# Patient Record
Sex: Male | Born: 1970 | Race: Black or African American | Hispanic: No | Marital: Single | Smoking: Current every day smoker
Health system: Southern US, Community
[De-identification: ages and names within clinical notes are randomized; demographics above are authoritative.]

## PROBLEM LIST (undated history)

## (undated) HISTORY — PX: HERNIA REPAIR: SHX51

---

## 2016-01-18 ENCOUNTER — Encounter (HOSPITAL_COMMUNITY): Payer: Self-pay

## 2016-01-18 ENCOUNTER — Emergency Department (HOSPITAL_COMMUNITY)
Admission: EM | Admit: 2016-01-18 | Discharge: 2016-01-18 | Disposition: A | Discharge status: Home / Self Care | Payer: Medicaid Other | Attending: Emergency Medicine | Admitting: Emergency Medicine

## 2016-01-18 DIAGNOSIS — F17210 Nicotine dependence, cigarettes, uncomplicated: Secondary | ICD-10-CM | POA: Diagnosis not present

## 2016-01-18 DIAGNOSIS — M25532 Pain in left wrist: Secondary | ICD-10-CM | POA: Diagnosis present

## 2016-01-18 DIAGNOSIS — M778 Other enthesopathies, not elsewhere classified: Secondary | ICD-10-CM | POA: Diagnosis not present

## 2016-01-18 MED ORDER — NAPROXEN 500 MG PO TABS: 500.0000 mg | ORAL_TABLET | Freq: Two times a day (BID) | ORAL | 0 refills | Status: DC

## 2016-01-18 NOTE — ED Triage Notes (Signed)
Pt c/o of left wrist pain over a month. Pt moves all fingers well. No other complaints.

## 2016-01-20 NOTE — ED Provider Notes (Signed)
[facility] Provider Note   CSN: [id number] Arrival date & time: 01/18/16  0350     History   Chief Complaint Chief Complaint  Patient presents with  . Carpal Tunnel    left    HPI Aaron Brewer is a 45 y.o. male.  Left wrist pain for the past month without known injury. 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 He was concerned he had developed carpal tunnel syndrome. No numbness or weakness. No swelling, redness of fever.    The history is provided by the patient. No language interpreter was used.    History reviewed. No pertinent past medical history.  There are no active problems to display for this patient.   Past Surgical History:  Procedure Laterality Date  . HERNIA REPAIR         Home Medications    Prior to Admission medications   Medication Sig Start Date End Date Taking? Authorizing Provider  Multiple Vitamin (MULTIVITAMIN WITH MINERALS) TABS tablet Take 1 tablet by mouth once a week.   Yes Historical Provider, MD  naproxen (NAPROSYN) 500 MG tablet Take 1 tablet (500 mg total) by mouth 2 (two) times daily. 01/18/16   Elpidio Anis, PA-C    Family History History reviewed. No pertinent family history.  Social History Social History  Substance Use Topics  . Smoking status: Current Every Day Smoker    Packs/day: 0.50    Types: Cigarettes  . Smokeless tobacco: Never Used  . Alcohol use No     Allergies   Review of patient's allergies indicates no known allergies.   Review of Systems Review of Systems  Constitutional: Negative for chills and fever.  Musculoskeletal:       See HPI  Skin: Negative.  Negative for color change.  Neurological: Negative.  Negative for numbness.     Physical Exam Updated Vital Signs BP 129/74 (BP Location: Right Arm)   Pulse 74   Temp 97.8 F (36.6 C) (Oral)   Resp 16   Ht 6' (1.829 m)   Wt 117.9 kg   SpO2 100%   BMI 35.26 kg/m   Physical Exam    Constitutional: He is oriented to person, place, and time. He appears well-developed and well-nourished.  Neck: Normal range of motion.  Pulmonary/Chest: Effort normal.  Musculoskeletal: Normal range of motion.  Left wrist unremarkable in appearance. No swelling, redness. FROM. Mild tenderness bilateral lateral aspects.   Neurological: He is alert and oriented to person, place, and time.  Skin: Skin is warm and dry. Capillary refill takes less than 2 seconds.     ED Treatments / Results  Labs (all labs ordered are listed, but only abnormal results are displayed) Labs Reviewed - No data to display  EKG  EKG Interpretation None       Radiology No results found.  Procedures Procedures (including critical care time)  Medications Ordered in ED Medications - No data to display   Initial Impression / Assessment and Plan / ED Course  I have reviewed the triage vital signs and the nursing notes.  Pertinent labs & imaging results that were available during my care of the patient were reviewed by me and considered in my medical decision making (see chart for details).  Clinical Course    Uncomplicated tendonitis of left wrist.   Final Clinical Impressions(s) / ED Diagnoses   Final diagnoses:  Tendonitis of wrist, left    New Prescriptions Discharge  Medication List as of 01/18/2016  5:28 AM    START taking these medications   Details  naproxen (NAPROSYN) 500 MG tablet Take 1 tablet (500 mg total) by mouth 2 (two) times daily., Starting Mon 01/18/2016, Print         MelbourneShari Korene Dula, PA-C 01/20/16 [id number]    Dione Boozeavid Glick, MD 01/20/16 [phone]

## 2016-03-25 ENCOUNTER — Encounter (HOSPITAL_COMMUNITY): Payer: Self-pay

## 2016-03-25 DIAGNOSIS — F17210 Nicotine dependence, cigarettes, uncomplicated: Secondary | ICD-10-CM | POA: Insufficient documentation

## 2016-03-25 DIAGNOSIS — R1032 Left lower quadrant pain: Secondary | ICD-10-CM | POA: Diagnosis present

## 2016-03-25 DIAGNOSIS — K529 Noninfective gastroenteritis and colitis, unspecified: Secondary | ICD-10-CM | POA: Insufficient documentation

## 2016-03-25 DIAGNOSIS — K5900 Constipation, unspecified: Secondary | ICD-10-CM | POA: Diagnosis not present

## 2016-03-25 LAB — URINALYSIS, ROUTINE W REFLEX MICROSCOPIC
Bilirubin Urine: NEGATIVE
Glucose, UA: NEGATIVE mg/dL
KETONES UR: NEGATIVE mg/dL
Leukocytes, UA: NEGATIVE
Nitrite: NEGATIVE
PH: 5 (ref 5.0–8.0)
Protein, ur: NEGATIVE mg/dL
SPECIFIC GRAVITY, URINE: 1.023 (ref 1.005–1.030)

## 2016-03-25 LAB — COMPREHENSIVE METABOLIC PANEL
ALBUMIN: 4.8 g/dL (ref 3.5–5.0)
ALK PHOS: 89 U/L (ref 38–126)
ALT: 13 U/L — AB (ref 17–63)
AST: 17 U/L (ref 15–41)
Anion gap: 9 (ref 5–15)
BUN: 8 mg/dL (ref 6–20)
CALCIUM: 9.8 mg/dL (ref 8.9–10.3)
CO2: 24 mmol/L (ref 22–32)
CREATININE: 0.99 mg/dL (ref 0.61–1.24)
Chloride: 106 mmol/L (ref 101–111)
GFR calc Af Amer: >60 mL/min (ref >60)
GFR calc non Af Amer: >60 mL/min (ref >60)
GLUCOSE: 99 mg/dL (ref 65–99)
Potassium: 4.4 mmol/L (ref 3.5–5.1)
SODIUM: 139 mmol/L (ref 135–145)
Total Bilirubin: 0.6 mg/dL (ref 0.3–1.2)
Total Protein: 7.2 g/dL (ref 6.5–8.1)

## 2016-03-25 LAB — CBC
HCT: 42.8 % (ref 39.0–52.0)
Hemoglobin: 15.6 g/dL (ref 13.0–17.0)
MCH: 29 pg (ref 26.0–34.0)
MCHC: 36.4 g/dL — AB (ref 30.0–36.0)
MCV: 79.6 fL (ref 78.0–100.0)
PLATELETS: 268 10*3/uL (ref 150–400)
RBC: 5.38 MIL/uL (ref 4.22–5.81)
RDW: 13.7 % (ref 11.5–15.5)
WBC: 8 10*3/uL (ref 4.0–10.5)

## 2016-03-25 LAB — LIPASE, BLOOD: Lipase: 21 U/L (ref 11–51)

## 2016-03-25 NOTE — ED Triage Notes (Signed)
Pt endorses not having a bowel movement in 2.5 days before forcing stool today and pt reports that he had a small "rabbit dropping" bowel movement with "a spot of blood" in it. Pt also states that he has went from 325lb to 236lbs in the last 9 months. Pt states he has been eating normally. Pt endorses 6 episodes of vomiting yesterday. Denies diarrhea.

## 2016-03-26 ENCOUNTER — Encounter (HOSPITAL_COMMUNITY): Payer: Self-pay | Admitting: Radiology

## 2016-03-26 ENCOUNTER — Emergency Department (HOSPITAL_COMMUNITY)
Admission: EM | Admit: 2016-03-26 | Discharge: 2016-03-26 | Disposition: A | Discharge status: Home / Self Care | Payer: Medicare Other | Attending: Emergency Medicine | Admitting: Emergency Medicine

## 2016-03-26 ENCOUNTER — Emergency Department (HOSPITAL_COMMUNITY): Payer: Medicare Other

## 2016-03-26 DIAGNOSIS — K529 Noninfective gastroenteritis and colitis, unspecified: Secondary | ICD-10-CM | POA: Diagnosis not present

## 2016-03-26 DIAGNOSIS — K5900 Constipation, unspecified: Secondary | ICD-10-CM

## 2016-03-26 MED ORDER — DOCUSATE SODIUM 100 MG PO CAPS: 100.0000 mg | ORAL_CAPSULE | Freq: Two times a day (BID) | ORAL | 0 refills | Status: AC

## 2016-03-26 MED ORDER — CIPROFLOXACIN HCL 500 MG PO TABS: 500.0000 mg | ORAL_TABLET | Freq: Two times a day (BID) | ORAL | 0 refills | Status: AC

## 2016-03-26 MED ORDER — IOPAMIDOL (ISOVUE-300) INJECTION 61%
INTRAVENOUS | Status: AC
  Administered 2016-03-26: 100 mL
  Filled 2016-03-26: qty 100

## 2016-03-26 MED ORDER — METRONIDAZOLE 500 MG PO TABS: 500.0000 mg | ORAL_TABLET | Freq: Three times a day (TID) | ORAL | 0 refills | Status: AC

## 2016-03-26 MED ORDER — TRAMADOL HCL 50 MG PO TABS: 50.0000 mg | ORAL_TABLET | Freq: Four times a day (QID) | ORAL | 0 refills | Status: AC | PRN

## 2016-03-26 NOTE — ED Notes (Signed)
Pt to CT via stretcher

## 2016-03-26 NOTE — ED Provider Notes (Signed)
[facility] Provider Note   CSN: [id number] Arrival date & time: 03/25/16  2235 By signing my name below, I, Aaron Brewer, attest that this documentation has been prepared under the direction and in the presence of Gilda Creasehristopher J Kycen Spalla, MD. Electronically Signed: Linus GalasMaharshi Brewer, ED Scribe. 03/26/16. 1:12 AM.  History   Chief Complaint Chief Complaint  Patient presents with  . Blood In Stools  . Abdominal Pain  The history is provided by the patient. No language interpreter was used.   HPI Comments: Aaron Brewer is a 45 y.o. male who presents to the Emergency Department complaining left lower abdominal pain that began today. Pt also reports 6 episodes of vomiting that has since resolved and blood in his stool after a forceful bowel movement. Prior today, he has not had the urge to have a BM for "quite some time." Pt denies any fevers, chills, CP, SOB, diarrhea, or any other symptoms at this time.   History reviewed. No pertinent past medical history.  There are no active problems to display for this patient.   Past Surgical History:  Procedure Laterality Date  . HERNIA REPAIR      Home Medications    Prior to Admission medications   Medication Sig Start Date End Date Taking? Authorizing Provider  ciprofloxacin (CIPRO) 500 MG tablet Take 1 tablet (500 mg total) by mouth 2 (two) times daily. 03/26/16   Gilda Creasehristopher J Basir Niven, MD  docusate sodium (COLACE) 100 MG capsule Take 1 capsule (100 mg total) by mouth every 12 (twelve) hours. 03/26/16   Gilda Creasehristopher J Nazli Penn, MD  metroNIDAZOLE (FLAGYL) 500 MG tablet Take 1 tablet (500 mg total) by mouth 3 (three) times daily. 03/26/16   Gilda Creasehristopher J Maylin Freeburg, MD  naproxen (NAPROSYN) 500 MG tablet Take 1 tablet (500 mg total) by mouth 2 (two) times daily. Patient not taking: Reported on 03/26/2016 01/18/16   Elpidio AnisShari Upstill, PA-C  traMADol (ULTRAM) 50 MG tablet Take 1 tablet (50 mg total) by mouth every 6 (six) hours as needed.  03/26/16   Gilda Creasehristopher J Bolton Canupp, MD    Family History History reviewed. No pertinent family history.  Social History Social History  Substance Use Topics  . Smoking status: Current Every Day Smoker    Packs/day: 0.50    Types: Cigarettes  . Smokeless tobacco: Never Used  . Alcohol use No    Allergies   Patient has no known allergies.  Review of Systems Review of Systems  Constitutional: Negative for chills and fever.  Respiratory: Negative for shortness of breath.   Cardiovascular: Negative for chest pain.  Gastrointestinal: Positive for abdominal pain, blood in stool and vomiting. Negative for diarrhea.  All other systems reviewed and are negative.  Physical Exam Updated Vital Signs BP 115/72   Pulse 67   Temp 97.9 F (36.6 C) (Oral)   Resp 20   Ht 6' (1.829 m)   Wt 236 lb 3.2 oz (107.1 kg)   SpO2 97%   BMI 32.03 kg/m   Physical Exam  Constitutional: He is oriented to person, place, and time. He appears well-developed and well-nourished. No distress.  HENT:  Head: Normocephalic and atraumatic.  Right Ear: Hearing normal.  Left Ear: Hearing normal.  Nose: Nose normal.  Mouth/Throat: Oropharynx is clear and moist and mucous membranes are normal.  Eyes: Conjunctivae and EOM are normal. Pupils are equal, round, and reactive to light.  Neck: Normal range of motion. Neck supple.  Cardiovascular: Regular rhythm, S1 normal and S2 normal.  Exam reveals no gallop and no friction rub.   No murmur heard. Pulmonary/Chest: Effort normal and breath sounds normal. No respiratory distress. He exhibits no tenderness.  Abdominal: Soft. Normal appearance and bowel sounds are normal. There is no hepatosplenomegaly. There is tenderness (LLQ). There is guarding (borderline, voluntary). There is no rebound, no tenderness at McBurney's point and negative Murphy's sign. No hernia.  Musculoskeletal: Normal range of motion.  Neurological: He is alert and oriented to person, place, and  time. He has normal strength. No cranial nerve deficit or sensory deficit. Coordination normal. GCS eye subscore is 4. GCS verbal subscore is 5. GCS motor subscore is 6.  Skin: Skin is warm, dry and intact. No rash noted. No cyanosis.  Psychiatric: He has a normal mood and affect. His speech is normal and behavior is normal. Thought content normal.  Nursing note and vitals reviewed.  ED Treatments / Results  DIAGNOSTIC STUDIES: Oxygen Saturation is 99% on room air, normal by my interpretation.    COORDINATION OF CARE: 1:12 AM Discussed treatment plan with pt at bedside and pt agreed to plan.  Labs (all labs ordered are listed, but only abnormal results are displayed) Labs Reviewed  COMPREHENSIVE METABOLIC PANEL - Abnormal; Notable for the following:       Result Value   ALT 13 (*)    All other components within normal limits  CBC - Abnormal; Notable for the following:    MCHC 36.4 (*)    All other components within normal limits  URINALYSIS, ROUTINE W REFLEX MICROSCOPIC - Abnormal; Notable for the following:    Hgb urine dipstick LARGE (*)    Bacteria, UA RARE (*)    Squamous Epithelial / LPF 0-5 (*)    All other components within normal limits  LIPASE, BLOOD    EKG  EKG Interpretation None       Radiology Ct Abdomen Pelvis W Contrast  Result Date: 03/26/2016 CLINICAL DATA:  Initial evaluation for acute left lower quadrant abdominal pain, rectal bleeding, hematuria. EXAM: CT ABDOMEN AND PELVIS WITH CONTRAST TECHNIQUE: Multidetector CT imaging of the abdomen and pelvis was performed using the standard protocol following bolus administration of intravenous contrast. CONTRAST:  ISOVUE-300 IOPAMIDOL (ISOVUE-300) INJECTION 61% COMPARISON:  None available. FINDINGS: Lower chest: The visualized lung bases are clear. Hepatobiliary: Subtle enhancing region within the subcapsular right hepatic lobe measuring 15 mm noted (series 9, image 27, indeterminate, but favored to reflect  vascular shunting. Liver otherwise unremarkable. Gallbladder within normal limits. No biliary dilatation. Pancreas: Pancreas within normal limits. Spleen: Spleen within normal limits. Adrenals/Urinary Tract: Adrenal glands are normal. Kidneys equal in size with symmetric enhancement. Subcentimeter hypodensity noted within the right kidney, too small the characterize, but statistically likely reflects a small cyst. No nephrolithiasis, hydronephrosis, or focal enhancing renal mass. No hydroureter. Bladder within normal limits. Stomach/Bowel: Stomach within normal limits. No evidence for bowel obstruction. Appendix well visualized within the right lower quadrant and is of normal caliber and appearance without associated inflammatory changes to suggest acute appendicitis. Colonic diverticulosis. Moderate amount of retained stool diffusely throughout the colon, suggestive of constipation. There is minimal induration with hazy stranding about the descending colon in the left abdomen, which may reflect early/mild acute colitis/diverticulitis (series 9, image 27). Vascular/Lymphatic: Normal intravascular enhancement seen throughout the intra- abdominal aorta and its branch vessels. No adenopathy. Reproductive: Prostate within normal limits. Other: No free air or fluid. Musculoskeletal: No acute osseous abnormality. No worrisome lytic or blastic osseous lesions. IMPRESSION: 1.  Mild induration with hazy stranding about the descending colon within the left abdomen. Given the patient's symptoms, findings are suspicious for mild and/or developing colitis/diverticulitis. 2. Moderate amount of retained stool diffusely throughout the colon, suggestive of constipation. 3. No other acute intra- abdominal or pelvic process identified. Electronically Signed   By: Rise MuBenjamin  McClintock M.D.   On: 03/26/2016 02:46    Procedures Procedures (including critical care time)  Medications Ordered in ED Medications  iopamidol (ISOVUE-300)  61 % injection (100 mLs  Contrast Given 03/26/16 0208)     Initial Impression / Assessment and Plan / ED Course  I have reviewed the triage vital signs and the nursing notes.  Pertinent labs & imaging results that were available during my care of the patient were reviewed by me and considered in my medical decision making (see chart for details).  Clinical Course   Patient presents to the emergency department for evaluation of abdominal pain and rectal bleeding. Patient reports that for the last couple of days he did not have the urge to defecate, then today felt an urge but was only able to pass a small hard stool with straining. He then noticed a small amount of blood present. He had some vomiting yesterday. There is no further vomiting or nausea today. He is afebrile. Examination did reveal tenderness in the left lower quadrant. CT scan was therefore performed and he does have evidence of early colitis or diverticulitis. Patient will be treated with Cipro and Flagyl, analgesia. He does have some evidence of constipation on CT. Colace, no laxatives with active colon inflammation. Follow up GI  Final Clinical Impressions(s) / ED Diagnoses   Final diagnoses:  Colitis  Constipation, unspecified constipation type    New Prescriptions New Prescriptions   CIPROFLOXACIN (CIPRO) 500 MG TABLET    Take 1 tablet (500 mg total) by mouth 2 (two) times daily.   DOCUSATE SODIUM (COLACE) 100 MG CAPSULE    Take 1 capsule (100 mg total) by mouth every 12 (twelve) hours.   METRONIDAZOLE (FLAGYL) 500 MG TABLET    Take 1 tablet (500 mg total) by mouth 3 (three) times daily.   TRAMADOL (ULTRAM) 50 MG TABLET    Take 1 tablet (50 mg total) by mouth every 6 (six) hours as needed.   I personally performed the services described in this documentation, which was scribed in my presence. The recorded information has been reviewed and is accurate.     Gilda Creasehristopher J Rhonda Vangieson, MD 03/26/16 [phone]

## 2016-05-20 ENCOUNTER — Encounter (HOSPITAL_COMMUNITY): Payer: Self-pay | Admitting: Emergency Medicine

## 2016-05-20 ENCOUNTER — Emergency Department (HOSPITAL_COMMUNITY)
Admission: EM | Admit: 2016-05-20 | Discharge: 2016-05-20 | Disposition: A | Discharge status: Home / Self Care | Payer: Medicare Other | Attending: Emergency Medicine | Admitting: Emergency Medicine

## 2016-05-20 ENCOUNTER — Emergency Department (HOSPITAL_COMMUNITY): Payer: Medicare Other

## 2016-05-20 DIAGNOSIS — F17210 Nicotine dependence, cigarettes, uncomplicated: Secondary | ICD-10-CM | POA: Diagnosis not present

## 2016-05-20 DIAGNOSIS — G629 Polyneuropathy, unspecified: Secondary | ICD-10-CM

## 2016-05-20 DIAGNOSIS — M79601 Pain in right arm: Secondary | ICD-10-CM | POA: Diagnosis present

## 2016-05-20 DIAGNOSIS — G5691 Unspecified mononeuropathy of right upper limb: Secondary | ICD-10-CM | POA: Insufficient documentation

## 2016-05-20 LAB — CBC WITH DIFFERENTIAL/PLATELET
Basophils Absolute: 0 10*3/uL (ref 0.0–0.1)
Basophils Relative: 0 %
Eosinophils Absolute: 0.1 10*3/uL (ref 0.0–0.7)
Eosinophils Relative: 1 %
HEMATOCRIT: 39.1 % (ref 39.0–52.0)
Hemoglobin: 13.8 g/dL (ref 13.0–17.0)
LYMPHS ABS: 2.3 10*3/uL (ref 0.7–4.0)
LYMPHS PCT: 32 %
MCH: 27.9 pg (ref 26.0–34.0)
MCHC: 35.3 g/dL (ref 30.0–36.0)
MCV: 79 fL (ref 78.0–100.0)
MONO ABS: 0.5 10*3/uL (ref 0.1–1.0)
MONOS PCT: 6 %
NEUTROS ABS: 4.4 10*3/uL (ref 1.7–7.7)
Neutrophils Relative %: 61 %
Platelets: 248 10*3/uL (ref 150–400)
RBC: 4.95 MIL/uL (ref 4.22–5.81)
RDW: 14.1 % (ref 11.5–15.5)
WBC: 7.2 10*3/uL (ref 4.0–10.5)

## 2016-05-20 LAB — BASIC METABOLIC PANEL
ANION GAP: 12 (ref 5–15)
BUN: 11 mg/dL (ref 6–20)
CALCIUM: 9.5 mg/dL (ref 8.9–10.3)
CO2: 24 mmol/L (ref 22–32)
CREATININE: 1.04 mg/dL (ref 0.61–1.24)
Chloride: 105 mmol/L (ref 101–111)
GFR calc Af Amer: >60 mL/min (ref >60)
GFR calc non Af Amer: >60 mL/min (ref >60)
GLUCOSE: 97 mg/dL (ref 65–99)
Potassium: 3.7 mmol/L (ref 3.5–5.1)
Sodium: 141 mmol/L (ref 135–145)

## 2016-05-20 LAB — TROPONIN I: Troponin I: <0.03 ng/mL (ref <0.03)

## 2016-05-20 MED ORDER — IBUPROFEN 800 MG PO TABS
800.0000 mg | ORAL_TABLET | Freq: Once | ORAL | Status: AC
  Administered 2016-05-20: 800 mg via ORAL
  Filled 2016-05-20: qty 1

## 2016-05-20 MED ORDER — NAPROXEN 500 MG PO TABS: 500.0000 mg | ORAL_TABLET | Freq: Two times a day (BID) | ORAL | 0 refills | Status: AC

## 2016-05-20 NOTE — ED Notes (Signed)
Patient transported to X-ray 

## 2016-05-20 NOTE — ED Notes (Signed)
Pt back from x-ray.

## 2016-05-20 NOTE — Discharge Instructions (Signed)
Use the antiinflammatories as prescribed. Follow up with a primary doctor. Return to the ED if you develop new or worsening symptoms.

## 2016-05-20 NOTE — ED Triage Notes (Signed)
Pt c/o sharp, shooting pain 7/10 on his right arm, denies any injury.

## 2016-05-20 NOTE — ED Provider Notes (Signed)
[facility] Provider Note   CSN: [id number] Arrival date & time: 05/20/16  0317     History   Chief Complaint Chief Complaint  Patient presents with  . Arm Pain    HPI Aaron Brewer is a 46 y.o. male.  Patient reports shooting pain in his right arm that onset around 11 PM while he was resting. The pain is from his forearm down that was in his upper arm earlier. He describes a dull ache but intermittent sharp shooting pain that rates down his arm lasting just a few seconds at a time. He did not try taking anything for this pain. Denies fall or trauma. He sometimes sleeps with his elbow bent and wonders if that is related. Denies any chest pain or shortness of breath. Denies any weakness in his hand. No numbness or tingling. No vomiting blood or incontinence. No fever or vomiting. No chronic medical conditions other than borderline blood sugar.   The history is provided by the patient.  Arm Pain  Pertinent negatives include no chest pain, no abdominal pain, no headaches and no shortness of breath.    History reviewed. No pertinent past medical history.  There are no active problems to display for this patient.   Past Surgical History:  Procedure Laterality Date  . HERNIA REPAIR         Home Medications    Prior to Admission medications   Medication Sig Start Date End Date Taking? Authorizing Provider  ciprofloxacin (CIPRO) 500 MG tablet Take 1 tablet (500 mg total) by mouth 2 (two) times daily. 03/26/16   Gilda Creasehristopher J Pollina, MD  docusate sodium (COLACE) 100 MG capsule Take 1 capsule (100 mg total) by mouth every 12 (twelve) hours. 03/26/16   Gilda Creasehristopher J Pollina, MD  metroNIDAZOLE (FLAGYL) 500 MG tablet Take 1 tablet (500 mg total) by mouth 3 (three) times daily. 03/26/16   Gilda Creasehristopher J Pollina, MD  naproxen (NAPROSYN) 500 MG tablet Take 1 tablet (500 mg total) by mouth 2 (two) times daily. Patient not taking: Reported on 03/26/2016 01/18/16   Elpidio AnisShari  Upstill, PA-C  traMADol (ULTRAM) 50 MG tablet Take 1 tablet (50 mg total) by mouth every 6 (six) hours as needed. 03/26/16   Gilda Creasehristopher J Pollina, MD    Family History No family history on file.  Social History Social History  Substance Use Topics  . Smoking status: Current Every Day Smoker    Packs/day: 0.50    Types: Cigarettes  . Smokeless tobacco: Never Used  . Alcohol use No     Allergies   Patient has no known allergies.   Review of Systems Review of Systems  Constitutional: Negative for activity change, appetite change and fever.  HENT: Negative for dental problem, rhinorrhea and sore throat.   Eyes: Negative for visual disturbance.  Respiratory: Negative for cough, chest tightness and shortness of breath.   Cardiovascular: Negative for chest pain.  Gastrointestinal: Negative for abdominal pain, nausea and vomiting.  Genitourinary: Negative for dysuria, hematuria and testicular pain.  Musculoskeletal: Positive for arthralgias and myalgias.  Neurological: Negative for dizziness, numbness and headaches.   A complete 10 system review of systems was obtained and all systems are negative except as noted in the HPI and PMH.    Physical Exam Updated Vital Signs BP 116/57 (BP Location: Left Arm)   Pulse 87   Temp 98.3 F (36.8 C) (Oral)   Resp 18   Ht 6' (1.829 m)   Wt 236 lb (  107 kg)   SpO2 99%   BMI 32.01 kg/m   Physical Exam  Constitutional: He is oriented to person, place, and time. He appears well-developed and well-nourished. No distress.  HENT:  Head: Normocephalic and atraumatic.  Mouth/Throat: Oropharynx is clear and moist. No oropharyngeal exudate.  Eyes: Conjunctivae and EOM are normal. Pupils are equal, round, and reactive to light.  Neck: Normal range of motion. Neck supple.  No meningismus.  Cardiovascular: Normal rate, regular rhythm, normal heart sounds and intact distal pulses.   No murmur heard. Pulmonary/Chest: Effort normal and breath  sounds normal. No respiratory distress.  Abdominal: Soft. There is no tenderness. There is no rebound and no guarding.  Musculoskeletal: Normal range of motion. He exhibits no edema or tenderness.  Equal grip strengths and radial pulses Median, ulnar, radial nerve function intact. FROM shoulder and elbow. without difficulty  Tinel's and phalen test negative.  Neurological: He is alert and oriented to person, place, and time. No cranial nerve deficit. He exhibits normal muscle tone. Coordination normal.  No ataxia on finger to nose bilaterally. No pronator drift. 5/5 strength throughout. CN 2-12 intact.Equal grip strength. Sensation intact.   Skin: Skin is warm.  Psychiatric: He has a normal mood and affect. His behavior is normal.  Nursing note and vitals reviewed.    ED Treatments / Results  Labs (all labs ordered are listed, but only abnormal results are displayed) Labs Reviewed  CBC WITH DIFFERENTIAL/PLATELET  BASIC METABOLIC PANEL  TROPONIN I    EKG  EKG Interpretation  Date/Time:  Friday May 20 2016 04:42:19 EST Ventricular Rate:  74 PR Interval:    QRS Duration: 93 QT Interval:  381 QTC Calculation: 423 R Axis:   53 Text Interpretation:  Sinus rhythm RSR' in V1 or V2, right VCD or RVH No previous ECGs available Confirmed by Manus Gunning  MD, Jun Osment [phone]) on 05/20/2016 4:52:22 AM Also confirmed by Manus Gunning  MD, Ahja Martello [phone]), editor Stout CT, Jola Babinski [phone])  on 05/20/2016 6:59:03 AM       Radiology Dg Cervical Spine Complete  Result Date: 05/20/2016 CLINICAL DATA:  Pain radiating to RIGHT arm, weakness and RIGHT neck stiffness beginning last night. No injury. EXAM: CERVICAL SPINE - COMPLETE 4+ VIEW COMPARISON:  None. FINDINGS: Cervical vertebral bodies and posterior elements appear intact and aligned to the inferior endplate of C7, the most caudal well visualized level. Straightened cervical lordosis. Intervertebral disc heights preserved. No significant osseous neural  foraminal narrowing. No destructive bony lesions. Lateral masses in alignment. Prevertebral and paraspinal soft tissue planes are nonsuspicious. Surgical clips versus calcifications lower neck. Nuchal ligament calcifications. IMPRESSION: Negative cervical spine radiographs. Electronically Signed   By: Awilda Metro M.D.   On: 05/20/2016 05:00    Procedures Procedures (including critical care time)  Medications Ordered in ED Medications  ibuprofen (ADVIL,MOTRIN) tablet 800 mg (not administered)     Initial Impression / Assessment and Plan / ED Course  I have reviewed the triage vital signs and the nursing notes.  Pertinent labs & imaging results that were available during my care of the patient were reviewed by me and considered in my medical decision making (see chart for details).     Patient burning and shooting pain and right arm that onset around 11 PM. Pain comes and goes. There is no associated numbness or tingling or weakness. Patient with equal grip strength and radial pulses. Cardinal hand movements are intact with full function of median, ulnar, radial nerves.  X-ray obtained  to evaluate for possible foraminal stenosis of C-spine. This was negative. Patient's pain improved with anti-inflammatories.  Lab reassuring.  EKG nsr. Troponin negative. Doubt cardiac etiology of pain. Suspect peripheral neuropathy, likely of ulnar or radial nerve. Intact strength and sensation.  Needs to establish care with PCP. Return precautions discussed.  Final Clinical Impressions(s) / ED Diagnoses   Final diagnoses:  Neuropathy [facility])    New Prescriptions New Prescriptions   No medications on file     Glynn Octave, MD 05/20/16 1503

## 2018-04-05 IMAGING — CR DG CERVICAL SPINE COMPLETE 4+V
5 series · 5 of 5 positions shown · non-contrast
Comparison: None.

CLINICAL DATA: Pain radiating to RIGHT arm, weakness and RIGHT neck
stiffness beginning last night. No injury.

EXAM:
CERVICAL SPINE - COMPLETE 4+ VIEW

[c-spine lat]
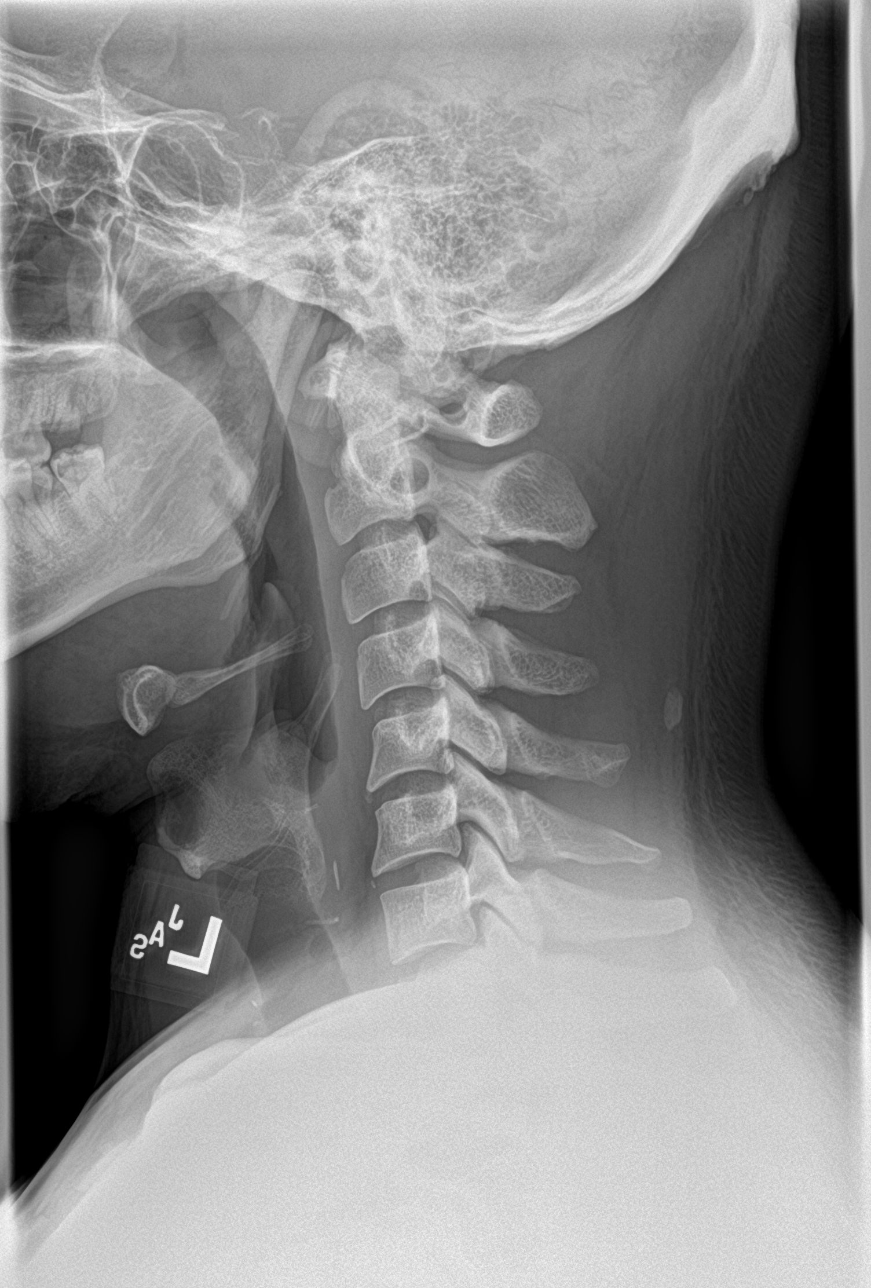

[c-spine obl (1 of 2)]
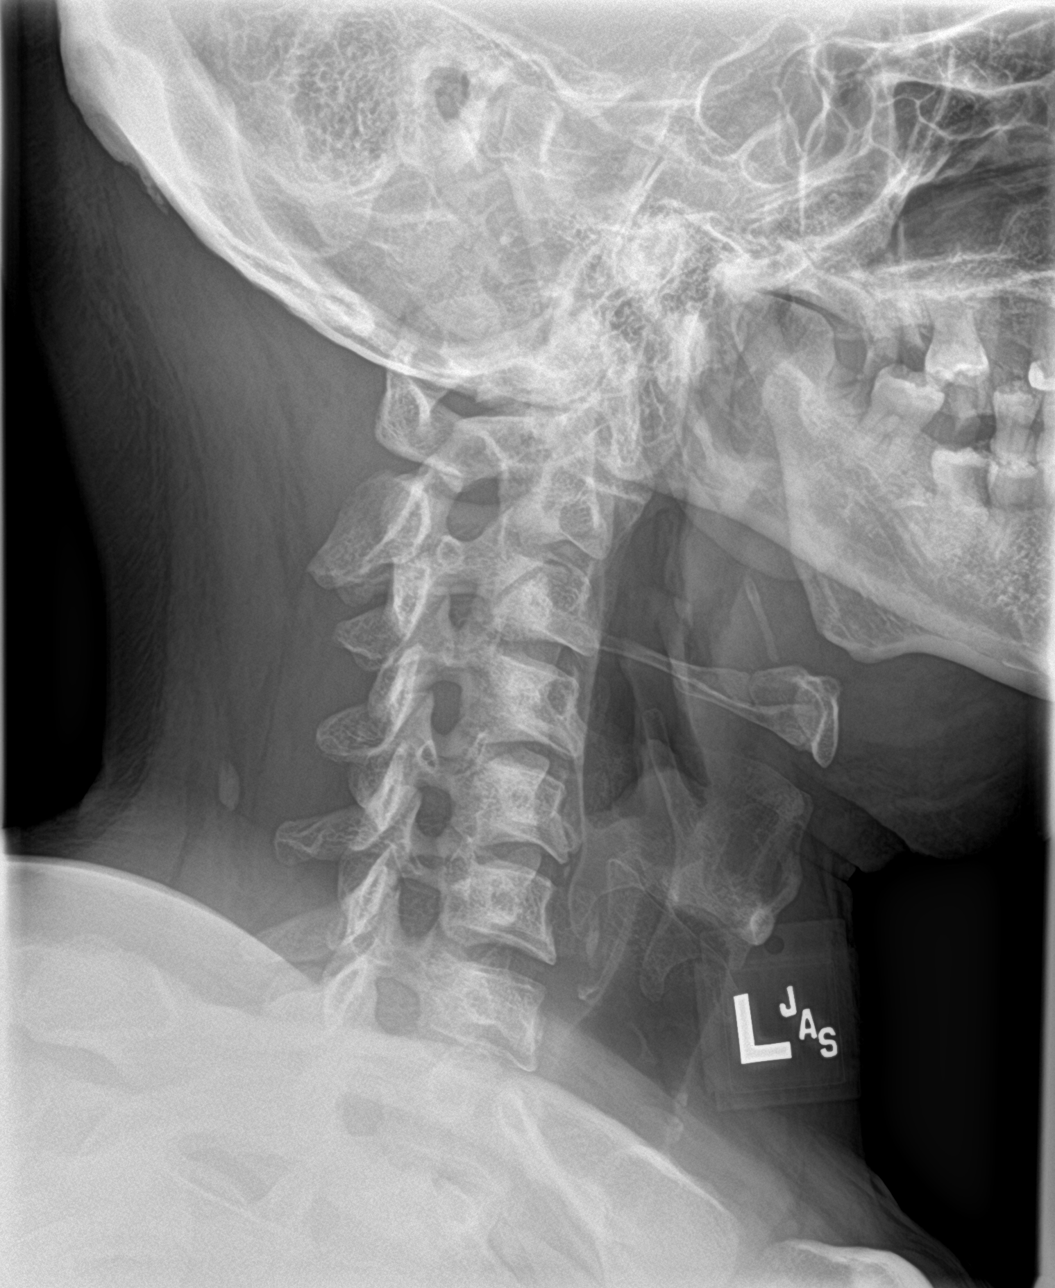

[c-spine obl (2 of 2)]
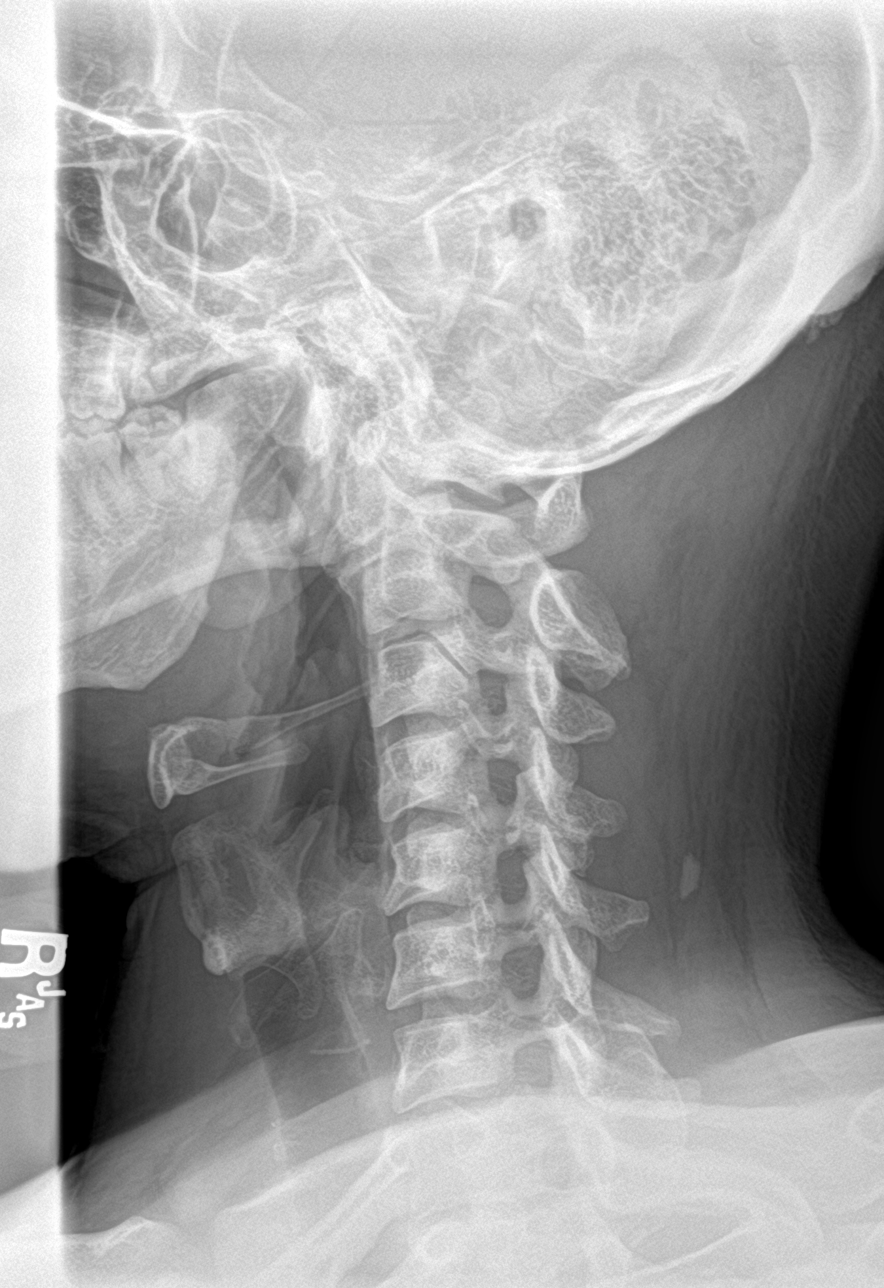

[c-spine ap]
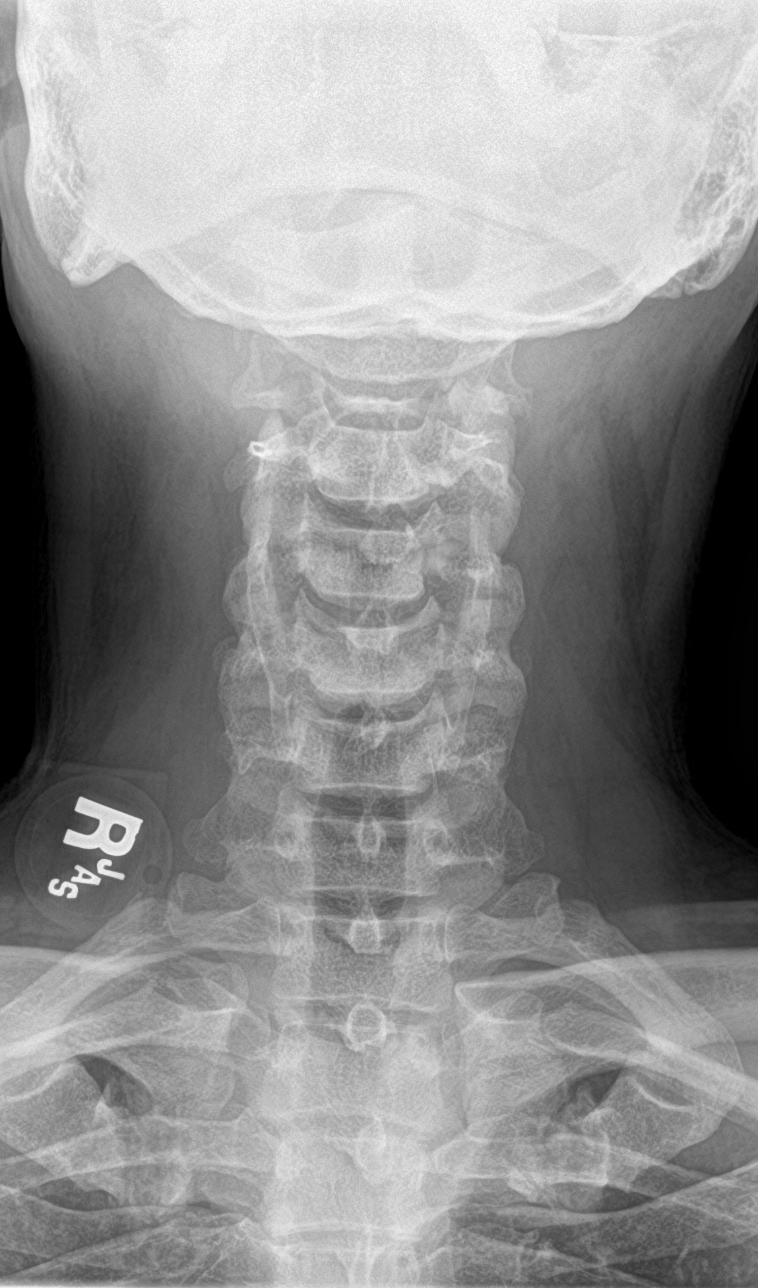

[c-spine open mouth]
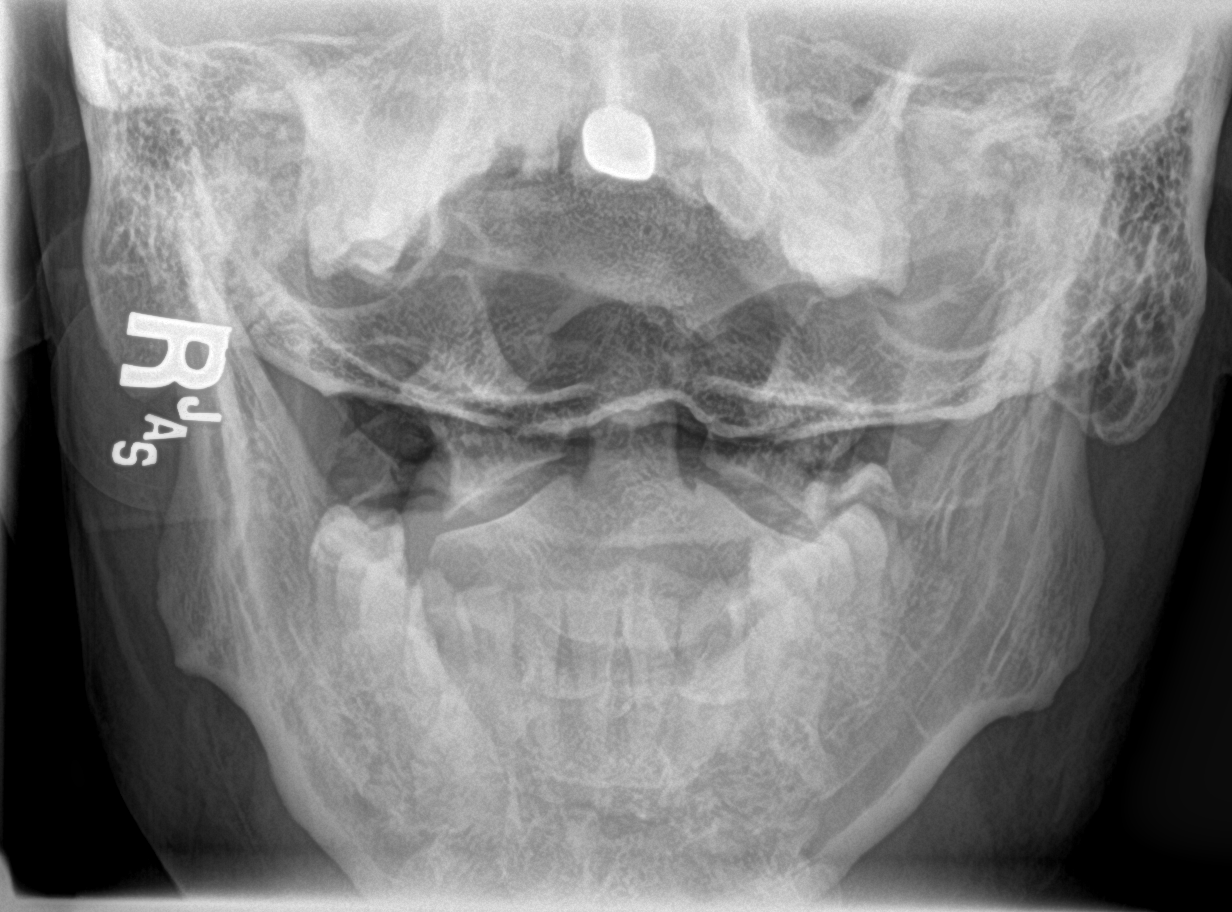

[5 of 5 positions shown; findings below may reference images not displayed]

FINDINGS: Cervical vertebral bodies and posterior elements appear intact and
aligned to the inferior endplate of C7, the most caudal well
visualized level. Straightened cervical lordosis. Intervertebral
disc heights preserved. No significant osseous neural foraminal
narrowing. No destructive bony lesions. Lateral masses in alignment.
Prevertebral and paraspinal soft tissue planes are nonsuspicious.
Surgical clips versus calcifications lower neck. Nuchal ligament
calcifications.
IMPRESSION: Negative cervical spine radiographs.
